# Patient Record
Sex: Female | Born: 1995 | Race: Black or African American | Hispanic: No | Marital: Single | Smoking: Never smoker
Health system: Southern US, Community
[De-identification: ages and names within clinical notes are randomized; demographics above are authoritative.]

---

## 2019-10-29 ENCOUNTER — Emergency Department
Admission: EM | Admit: 2019-10-29 | Discharge: 2019-10-29 | Disposition: A | Discharge status: Home / Self Care | Payer: BC Managed Care – PPO | Source: Home / Self Care

## 2019-10-29 ENCOUNTER — Other Ambulatory Visit: Payer: Self-pay

## 2019-10-29 ENCOUNTER — Encounter: Payer: Self-pay | Admitting: Emergency Medicine

## 2019-10-29 NOTE — ED Triage Notes (Signed)
Place TB test

## 2019-10-31 ENCOUNTER — Emergency Department (INDEPENDENT_AMBULATORY_CARE_PROVIDER_SITE_OTHER)
Admission: EM | Admit: 2019-10-31 | Discharge: 2019-10-31 | Disposition: A | Discharge status: Home / Self Care | Payer: BC Managed Care – PPO | Source: Home / Self Care

## 2019-10-31 DIAGNOSIS — Z111 Encounter for screening for respiratory tuberculosis: Secondary | ICD-10-CM

## 2019-10-31 NOTE — ED Notes (Signed)
Pt here for Tb skin test read - negative (29mm)

## 2020-05-22 ENCOUNTER — Other Ambulatory Visit: Payer: Self-pay

## 2020-05-22 ENCOUNTER — Emergency Department
Admission: EM | Admit: 2020-05-22 | Discharge: 2020-05-22 | Disposition: A | Discharge status: Home / Self Care | Payer: BC Managed Care – PPO | Source: Home / Self Care

## 2020-05-22 DIAGNOSIS — R0981 Nasal congestion: Secondary | ICD-10-CM | POA: Diagnosis not present

## 2020-05-22 DIAGNOSIS — R519 Headache, unspecified: Secondary | ICD-10-CM

## 2020-05-22 NOTE — Discharge Instructions (Signed)
You may take 500mg  acetaminophen every 4-6 hours or in combination with ibuprofen 400-600mg  every 6-8 hours as needed for pain, inflammation, and fever. You may continue your Flonase and try sinus rinses to help relieve pressure.  Be sure to well hydrated with clear liquids and get at least 8 hours of sleep at night, preferably more while sick.   Please follow up with family medicine in 1 week if needed.

## 2020-05-22 NOTE — ED Triage Notes (Signed)
Pt c/o sinus issues, tension headache x 3 days. Hx of allergies.   Robitussin prn.

## 2020-05-22 NOTE — ED Provider Notes (Signed)
[facility]    CSN: [id number] Arrival date & time: 05/22/20  1514      History   Chief Complaint Chief Complaint  Patient presents with  . Sinus issues    HPI Selena Benton is a 24 y.o. female.   HPI Selena Benton is a 24 y.o. female presenting to UC with c/o sinus congestion and tension headache for about 3 days. Mild cough from post-nasal drainage. She has been fully vaccinated for COVID but wants to make sure she does not have a breakthrough case.  Denies fever, chills, n/v/d. No chest pain or SOB.    History reviewed. No pertinent past medical history.  There are no problems to display for this patient.   History reviewed. No pertinent surgical history.  OB History   No obstetric history on file.      Home Medications    Prior to Admission medications   Medication Sig Start Date End Date Taking? Authorizing Provider  cetirizine (ZYRTEC) 10 MG tablet Take 10 mg by mouth as needed for allergies.   Yes [provider]    Family History Family History  Problem Relation Age of Onset  . Healthy Mother   . Healthy Father     Social History Social History   Tobacco Use  . Smoking status: Never Smoker  . Smokeless tobacco: Never Used  Vaping Use  . Vaping Use: Never used  Substance Use Topics  . Alcohol use: Yes  . Drug use: Not on file     Allergies   Patient has no known allergies.   Review of Systems Review of Systems  Constitutional: Negative for chills and fever.  HENT: Positive for congestion. Negative for ear pain, sore throat, trouble swallowing and voice change.   Respiratory: Positive for cough. Negative for shortness of breath.   Cardiovascular: Negative for chest pain and palpitations.  Gastrointestinal: Negative for abdominal pain, diarrhea, nausea and vomiting.  Musculoskeletal: Negative for arthralgias, back pain and myalgias.  Skin: Negative for rash.  Neurological: Positive for headaches. Negative for  dizziness and light-headedness.  All other systems reviewed and are negative.    Physical Exam Triage Vital Signs ED Triage Vitals  Enc Vitals Group     BP 05/22/20 1553 (!) 148/96     Pulse Rate 05/22/20 1553 (!) 107     Resp 05/22/20 1553 18     Temp 05/22/20 1553 98.5 F (36.9 C)     Temp Source 05/22/20 1553 Oral     SpO2 05/22/20 1553 99 %     Weight --      Height --      Head Circumference --      Peak Flow --      Pain Score 05/22/20 1555 1     Pain Loc --      Pain Edu? --      Excl. in GC? --    No data found.  Updated Vital Signs BP (!) 148/96 (BP Location: Left Arm)   Pulse (!) 107   Temp 98.5 F (36.9 C) (Oral)   Resp 18   LMP 05/16/2020 (Approximate)   SpO2 99%   Visual Acuity Right Eye Distance:   Left Eye Distance:   Bilateral Distance:    Right Eye Near:   Left Eye Near:    Bilateral Near:     Physical Exam Vitals and nursing note reviewed.  Constitutional:      General: She is not in acute distress.  Appearance: Normal appearance. She is well-developed. She is not ill-appearing, toxic-appearing or diaphoretic.  HENT:     Head: Normocephalic and atraumatic.     Right Ear: Tympanic membrane and ear canal normal.     Left Ear: Tympanic membrane and ear canal normal.     Nose: Nose normal.     Right Sinus: No maxillary sinus tenderness or frontal sinus tenderness.     Left Sinus: No maxillary sinus tenderness or frontal sinus tenderness.     Mouth/Throat:     Lips: Pink.     Mouth: Mucous membranes are moist.     Pharynx: Oropharynx is clear. Uvula midline. No pharyngeal swelling, oropharyngeal exudate, posterior oropharyngeal erythema or uvula swelling.  Cardiovascular:     Rate and Rhythm: Normal rate and regular rhythm.  Pulmonary:     Effort: Pulmonary effort is normal. No respiratory distress.     Breath sounds: Normal breath sounds. No stridor. No wheezing, rhonchi or rales.  Musculoskeletal:        General: Normal range of  motion.     Cervical back: Normal range of motion and neck supple. No rigidity or tenderness.  Lymphadenopathy:     Cervical: No cervical adenopathy.  Skin:    General: Skin is warm and dry.  Neurological:     Mental Status: She is alert and oriented to person, place, and time.  Psychiatric:        Behavior: Behavior normal.      UC Treatments / Results  Labs (all labs ordered are listed, but only abnormal results are displayed) Labs Reviewed  NOVEL CORONAVIRUS, NAA    EKG   Radiology No results found.  Procedures Procedures (including critical care time)  Medications Ordered in UC Medications - No data to display  Initial Impression / Assessment and Plan / UC Course  I have reviewed the triage vital signs and the nursing notes.  Pertinent labs & imaging results that were available during my care of the patient were reviewed by me and considered in my medical decision making (see chart for details).    No evidence of bacterial infection at this time COVID test sent to lab F/u with PCP as needed AVS given  Final Clinical Impressions(s) / UC Diagnoses   Final diagnoses:  Nasal congestion  Sinus headache     Discharge Instructions     You may take 500mg  acetaminophen every 4-6 hours or in combination with ibuprofen 400-600mg  every 6-8 hours as needed for pain, inflammation, and fever. You may continue your Flonase and try sinus rinses to help relieve pressure.  Be sure to well hydrated with clear liquids and get at least 8 hours of sleep at night, preferably more while sick.   Please follow up with family medicine in 1 week if needed.     ED Prescriptions    None     PDMP not reviewed this encounter.   , Lurene Shadow 05/22/20 2247

## 2020-05-24 LAB — NOVEL CORONAVIRUS, NAA: SARS-CoV-2, NAA: NOT DETECTED

## 2020-05-24 LAB — SARS-COV-2, NAA 2 DAY TAT

## 2020-06-03 ENCOUNTER — Emergency Department (INDEPENDENT_AMBULATORY_CARE_PROVIDER_SITE_OTHER)
Admission: EM | Admit: 2020-06-03 | Discharge: 2020-06-03 | Disposition: A | Discharge status: Home / Self Care | Payer: BC Managed Care – PPO | Source: Home / Self Care | Attending: Family Medicine | Admitting: Family Medicine

## 2020-06-03 ENCOUNTER — Other Ambulatory Visit: Payer: Self-pay

## 2020-06-03 ENCOUNTER — Emergency Department (INDEPENDENT_AMBULATORY_CARE_PROVIDER_SITE_OTHER): Payer: BC Managed Care – PPO

## 2020-06-03 DIAGNOSIS — J069 Acute upper respiratory infection, unspecified: Secondary | ICD-10-CM

## 2020-06-03 DIAGNOSIS — J9801 Acute bronchospasm: Secondary | ICD-10-CM

## 2020-06-03 DIAGNOSIS — R05 Cough: Secondary | ICD-10-CM | POA: Diagnosis not present

## 2020-06-03 MED ORDER — AZITHROMYCIN 250 MG PO TABS: ORAL_TABLET | ORAL | 0 refills | Status: AC

## 2020-06-03 MED ORDER — PREDNISONE 20 MG PO TABS: ORAL_TABLET | ORAL | 0 refills | Status: AC

## 2020-06-03 NOTE — Discharge Instructions (Addendum)
Take plain guaifenesin (1200mg  extended release tabs such as Mucinex) twice daily, with plenty of water, for cough and congestion.  May add Pseudoephedrine (30mg , one or two every 4 to 6 hours) for sinus congestion if needed.  Get adequate rest.   May use Afrin nasal spray (or generic oxymetazoline) each morning for about 5 days and then discontinue.  Also recommend using saline nasal spray several times daily and saline nasal irrigation (AYR is a common brand).  Use Flonase nasal spray each morning after using Afrin nasal spray and saline nasal irrigation. Stop all antihistamines (Zyrtec, etc) for now, and other non-prescription cough/cold preparations. May take Delsym Cough Suppressant ("12 Hour Cough Relief") at bedtime for nighttime cough.

## 2020-06-03 NOTE — ED Provider Notes (Signed)
[facility]    CSN: [id number] Arrival date & time: 06/03/20  1418      History   Chief Complaint Chief Complaint  Patient presents with  . Cough    HPI Selena Benton is a 24 y.o. female.   Patient reports that she developed sinus congestion about 17 days ago, followed by a sore throat, chills, headache, chills, and cough. Her sore throat resolved but her cough has become persistent and worse at night.  She complains of tightness in her anterior chest. She has a history of exercise asthma.  She has had three recent negative COVID tests.  The history is provided by the patient.    No past medical history on file.  There are no problems to display for this patient.   No past surgical history on file.  OB History   No obstetric history on file.      Home Medications    Prior to Admission medications   Medication Sig Start Date End Date Taking? Authorizing Provider  azithromycin (ZITHROMAX Z-PAK) 250 MG tablet Take 2 tabs today; then begin one tab once daily for 4 more days.. 06/03/20   Lattie Haw, MD  cetirizine (ZYRTEC) 10 MG tablet Take 10 mg by mouth as needed for allergies.    [provider]  predniSONE (DELTASONE) 20 MG tablet Take one tab by mouth twice daily for 4 days, then one daily. Take with food. 06/03/20   Lattie Haw, MD    Family History Family History  Problem Relation Age of Onset  . Healthy Mother   . Healthy Father     Social History Social History   Tobacco Use  . Smoking status: Never Smoker  . Smokeless tobacco: Never Used  Vaping Use  . Vaping Use: Never used  Substance Use Topics  . Alcohol use: Yes  . Drug use: Not on file     Allergies   Patient has no known allergies.   Review of Systems Review of Systems  + sore throat, resolved + cough No pleuritic pain ? wheezing + nasal congestion + post-nasal drainage No sinus pain/pressure No itchy/red eyes No earache No hemoptysis No  SOB No fever, + chills No nausea No vomiting No abdominal pain No diarrhea No urinary symptoms No skin rash + fatigue No myalgias + headache    Physical Exam Triage Vital Signs ED Triage Vitals  Enc Vitals Group     BP 06/03/20 1633 (!) 144/83     Pulse Rate 06/03/20 1633 (!) 105     Resp --      Temp 06/03/20 1633 98.9 F (37.2 C)     Temp Source 06/03/20 1633 Oral     SpO2 06/03/20 1633 98 %     Weight 06/03/20 1634 190 lb (86.2 kg)     Height 06/03/20 1634 5\' 1"  (1.549 m)     Head Circumference --      Peak Flow --      Pain Score 06/03/20 1633 0     Pain Loc --      Pain Edu? --      Excl. in GC? --    No data found.  Updated Vital Signs BP (!) 144/83 (BP Location: Right Arm)   Pulse (!) 105   Temp 98.9 F (37.2 C) (Oral)   Ht 5\' 1"  (1.549 m)   Wt 86.2 kg   LMP 05/16/2020 (Approximate)   SpO2 98%   BMI 35.90 kg/m  Visual Acuity Right Eye Distance:   Left Eye Distance:   Bilateral Distance:    Right Eye Near:   Left Eye Near:    Bilateral Near:     Physical Exam Nursing notes and Vital Signs reviewed. Appearance:  Patient appears stated age, and in no acute distress Eyes:  Pupils are equal, round, and reactive to light and accomodation.  Extraocular movement is intact.  Conjunctivae are not inflamed  Ears:  Canals normal.  Tympanic membranes normal.  Nose:  Mildly congested turbinates.  No sinus tenderness.  Pharynx:  Normal Neck:  Supple.  Prominent nontender lateral nodes present.   Lungs:  Clear to auscultation.  Breath sounds are equal.  Moving air well. Heart:  Regular rate and rhythm without murmurs, rubs, or gallops.  Abdomen:  Nontender without masses or hepatosplenomegaly.  Bowel sounds are present.  No CVA or flank tenderness.  Extremities:  No edema.  Skin:  No rash present.   UC Treatments / Results  Labs (all labs ordered are listed, but only abnormal results are displayed) Labs Reviewed - No data to  display  EKG   Radiology DG Chest 2 View  Result Date: 06/03/2020 CLINICAL DATA:  Persistent cough for 10 days. EXAM: CHEST - 2 VIEW COMPARISON:  None. FINDINGS: The cardiomediastinal contours are normal. The lungs are clear. Pulmonary vasculature is normal. No consolidation, pleural effusion, or pneumothorax. No acute osseous abnormalities are seen. IMPRESSION: Negative radiographs of the chest. Electronically Signed   By: Narda Rutherford M.D.   On: 06/03/2020 18:55    Procedures Procedures (including critical care time)  Medications Ordered in UC Medications - No data to display  Initial Impression / Assessment and Plan / UC Course  I have reviewed the triage vital signs and the nursing notes.  Pertinent labs & imaging results that were available during my care of the patient were reviewed by me and considered in my medical decision making (see chart for details).    Because of her history of exercise asthma, will begin prednisone burst/taper and Z-pak. Followup with Family Doctor if not improved in one week.    Final Clinical Impressions(s) / UC Diagnoses   Final diagnoses:  Viral URI with cough  Bronchospasm, acute     Discharge Instructions     Take plain guaifenesin (1200mg  extended release tabs such as Mucinex) twice daily, with plenty of water, for cough and congestion.  May add Pseudoephedrine (30mg , one or two every 4 to 6 hours) for sinus congestion if needed.  Get adequate rest.   May use Afrin nasal spray (or generic oxymetazoline) each morning for about 5 days and then discontinue.  Also recommend using saline nasal spray several times daily and saline nasal irrigation (AYR is a common brand).  Use Flonase nasal spray each morning after using Afrin nasal spray and saline nasal irrigation. Stop all antihistamines (Zyrtec, etc) for now, and other non-prescription cough/cold preparations. May take Delsym Cough Suppressant ("12 Hour Cough Relief") at bedtime for  nighttime cough.       ED Prescriptions    Medication Sig Dispense Auth. Provider   azithromycin (ZITHROMAX Z-PAK) 250 MG tablet Take 2 tabs today; then begin one tab once daily for 4 more days.. 6 tablet , MD   predniSONE (DELTASONE) 20 MG tablet Take one tab by mouth twice daily for 4 days, then one daily. Take with food. 12 tablet , MD        Lattie Haw  A, MD 06/04/20 2209

## 2020-06-03 NOTE — ED Triage Notes (Signed)
Productive cough x 10 days, tested Neg for Covid x 3, cough persists and is getting worse.

## 2020-06-06 ENCOUNTER — Emergency Department (INDEPENDENT_AMBULATORY_CARE_PROVIDER_SITE_OTHER)
Admission: EM | Admit: 2020-06-06 | Discharge: 2020-06-06 | Disposition: A | Discharge status: Home / Self Care | Payer: BC Managed Care – PPO | Source: Home / Self Care

## 2020-06-06 ENCOUNTER — Telehealth: Payer: Self-pay

## 2020-06-06 ENCOUNTER — Other Ambulatory Visit: Payer: Self-pay

## 2020-06-06 DIAGNOSIS — T50905A Adverse effect of unspecified drugs, medicaments and biological substances, initial encounter: Secondary | ICD-10-CM | POA: Diagnosis not present

## 2020-06-06 MED ORDER — ALBUTEROL SULFATE HFA 108 (90 BASE) MCG/ACT IN AERS
2.0000 | INHALATION_SPRAY | RESPIRATORY_TRACT | Status: DC | PRN
  Administered 2020-06-06: 2 via RESPIRATORY_TRACT

## 2020-06-06 NOTE — Discharge Instructions (Signed)
Complete antibiotic. Stop prednisone. You can use albuterol inhaler 2 puffs every 4-6 hours as needed.

## 2020-06-06 NOTE — ED Triage Notes (Signed)
Pt seen 9/28 for viral cough. rx'd prednisone and z pak after visit. Also taking mucinex prn. Pt c/o elevated HR ranging between 87-110 resting. Also mentions tingling in arms. Not sure if due to meds. Pt monitoring BP at home and states it was 180/120.

## 2020-06-06 NOTE — Telephone Encounter (Signed)
Pt called asking about her resting heart rate being in lows 90s. Pt is taking mucinex as well as prednisone and zpak which . Advised to continue monitoring heart rate through her recovery and follow up with PCP if it doesn't drop to her normal after shes done with meds. Pt acknowledges.

## 2020-06-06 NOTE — ED Provider Notes (Signed)
[facility]    CSN: [id number] Arrival date & time: 06/06/20  1807      History   Chief Complaint Chief Complaint  Patient presents with  . Tachycardia    HPI Selena Benton is a 24 y.o. female.    HPI  Patient seen on 06/03/20 and treated for a viral upper respiratory infection.  Patient was treated with azithromycin and prednisone.  She reports since taking prednisone she has had intervals of tachycardia and palpitations. That her heart rate got into the 130s and 140s today while sitting in class. She took her last dose this morning.  She reports that her symptoms have significantly improved and she continues to take the remainder doses of her azithromycin.  She is not having any wheezing, shortness of breath or cyclic type of coughing.  History reviewed. No pertinent past medical history.  There are no problems to display for this patient.   History reviewed. No pertinent surgical history.  OB History   No obstetric history on file.      Home Medications    Prior to Admission medications   Medication Sig Start Date End Date Taking? Authorizing Provider  azithromycin (ZITHROMAX Z-PAK) 250 MG tablet Take 2 tabs today; then begin one tab once daily for 4 more days.. 06/03/20   Lattie Haw, MD  cetirizine (ZYRTEC) 10 MG tablet Take 10 mg by mouth as needed for allergies.    [provider]  predniSONE (DELTASONE) 20 MG tablet Take one tab by mouth twice daily for 4 days, then one daily. Take with food. 06/03/20   Lattie Haw, MD    Family History Family History  Problem Relation Age of Onset  . Healthy Mother   . Healthy Father     Social History Social History   Tobacco Use  . Smoking status: Never Smoker  . Smokeless tobacco: Never Used  Vaping Use  . Vaping Use: Never used  Substance Use Topics  . Alcohol use: Yes  . Drug use: Not on file     Allergies   Prednisone Review of Systems Review of Systems Pertinent  negatives listed in HPI Physical Exam Triage Vital Signs ED Triage Vitals [06/06/20 1817]  Enc Vitals Group     BP (!) 131/91     Pulse Rate 82     Resp 18     Temp 98.9 F (37.2 C)     Temp Source Oral     SpO2 99 %     Weight      Height      Head Circumference      Peak Flow      Pain Score 0     Pain Loc      Pain Edu?      Excl. in GC?    No data found.  Updated Vital Signs BP (!) 131/91 (BP Location: Right Arm)   Pulse 82   Temp 98.9 F (37.2 C) (Oral)   Resp 18   LMP 05/16/2020 (Approximate)   SpO2 99%   Visual Acuity Right Eye Distance:   Left Eye Distance:   Bilateral Distance:    Right Eye Near:   Left Eye Near:    Bilateral Near:     Physical Exam   UC Treatments / Results  Labs (all labs ordered are listed, but only abnormal results are displayed) Labs Reviewed - No data to display  EKG   Radiology No results found.  Procedures Procedures (including  critical care time)  Medications Ordered in UC Medications  albuterol (VENTOLIN HFA) 108 (90 Base) MCG/ACT inhaler 2 puff (2 puffs Inhalation Given 06/06/20 1903)    Initial Impression / Assessment and Plan / UC Course  I have reviewed the triage vital signs and the nursing notes.  Pertinent labs & imaging results that were available during my care of the patient were reviewed by me and considered in my medical decision making (see chart for details).    Discontinue prednisone.  Complete remaining dose of azithromycin. Provided an albuterol inhaler in the event wheezing or shortness of breath redevelops.  Follow-up as needed with primary care. Final Clinical Impressions(s) / UC Diagnoses   Final diagnoses:  Adverse effect of drug, initial encounter     Discharge Instructions     Complete antibiotic. Stop prednisone. You can use albuterol inhaler 2 puffs every 4-6 hours as needed.    ED Prescriptions    None     PDMP not reviewed this encounter.   Bing Neighbors,  FNP 06/06/20 1949

## 2021-01-30 IMAGING — DX DG CHEST 2V
2 series · 2 of 2 positions shown · non-contrast
Comparison: None.

CLINICAL DATA: Persistent cough for 10 days.

EXAM:
CHEST - 2 VIEW

[chest pa]
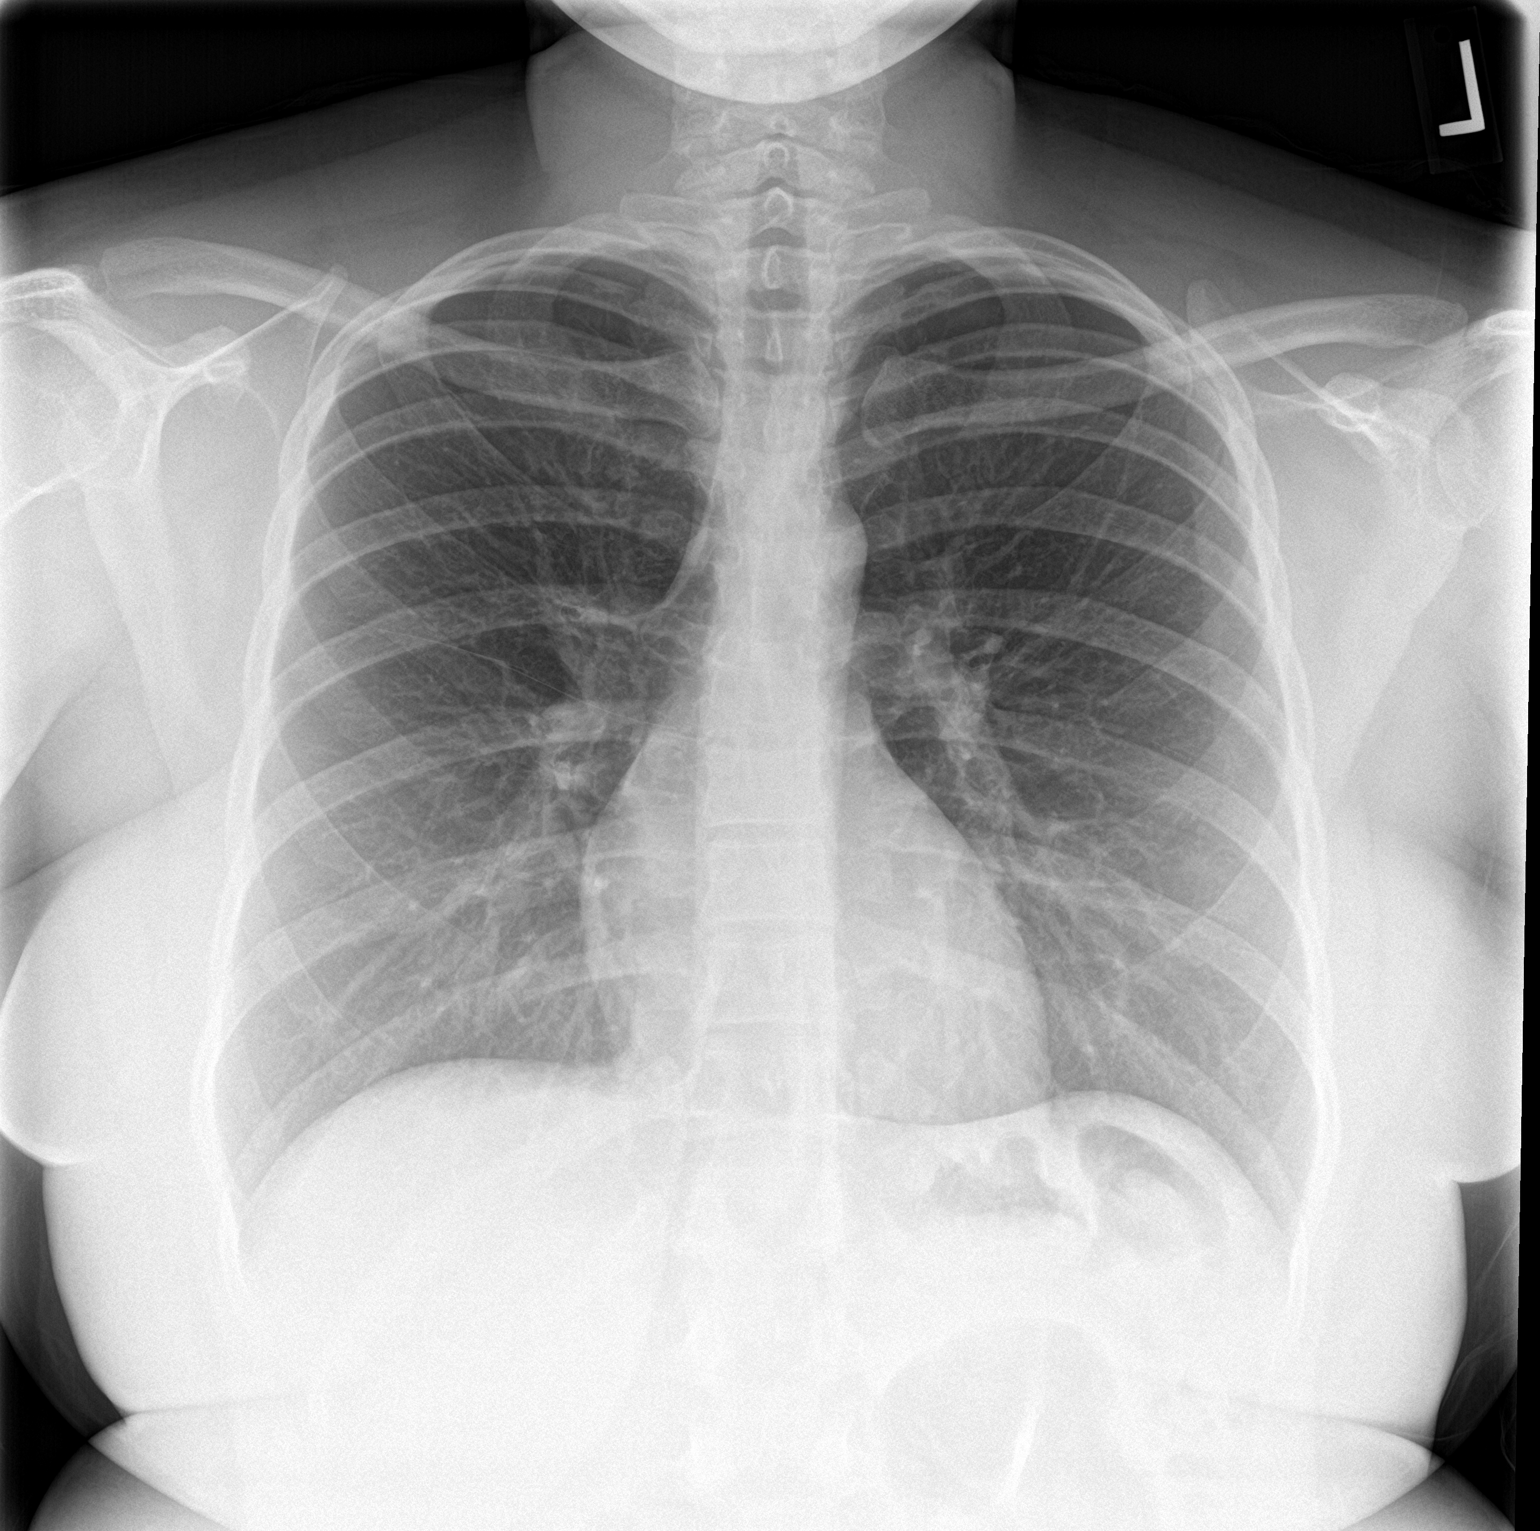

[chest lat]
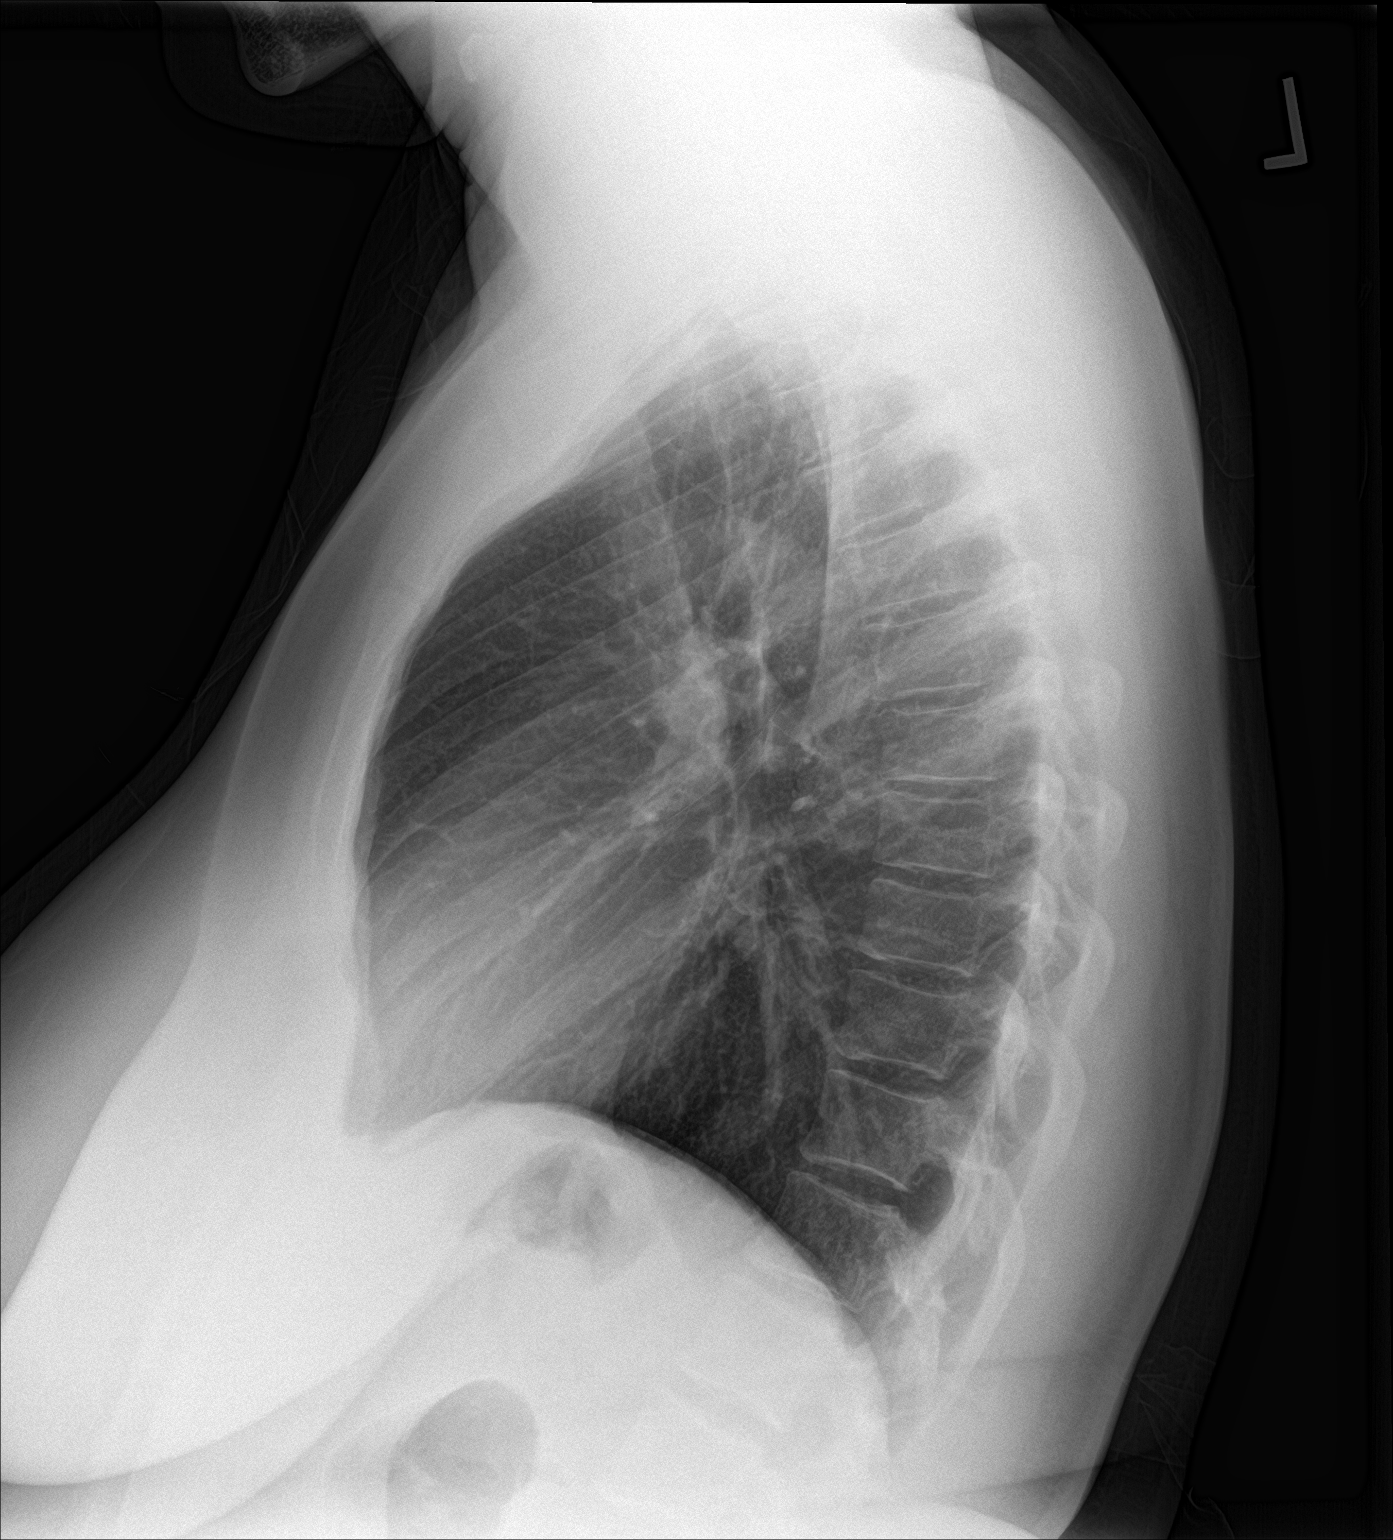

[2 of 2 positions shown; findings below may reference images not displayed]

FINDINGS: The cardiomediastinal contours are normal. The lungs are clear.
Pulmonary vasculature is normal. No consolidation, pleural effusion,
or pneumothorax. No acute osseous abnormalities are seen.
IMPRESSION: Negative radiographs of the chest.
